# Patient Record
Sex: Female | Born: 1985 | Race: Black or African American | Hispanic: No | Marital: Single | Smoking: Current every day smoker
Health system: Southern US, Community
[De-identification: ages and names within clinical notes are randomized; demographics above are authoritative.]

## PROBLEM LIST (undated history)

## (undated) DIAGNOSIS — F32A Depression, unspecified: Secondary | ICD-10-CM

## (undated) HISTORY — PX: CYST EXCISION: SHX5701

## (undated) HISTORY — DX: Depression, unspecified: F32.A

---

## 2006-06-16 ENCOUNTER — Emergency Department (HOSPITAL_COMMUNITY): Admission: EM | Admit: 2006-06-16 | Discharge: 2006-06-16 | Payer: Self-pay | Admitting: Emergency Medicine

## 2006-06-25 ENCOUNTER — Emergency Department (HOSPITAL_COMMUNITY): Admission: EM | Admit: 2006-06-25 | Discharge: 2006-06-25 | Payer: Self-pay | Admitting: Family Medicine

## 2006-06-29 ENCOUNTER — Emergency Department (HOSPITAL_COMMUNITY): Admission: EM | Admit: 2006-06-29 | Discharge: 2006-06-29 | Payer: Self-pay | Admitting: Emergency Medicine

## 2006-09-28 ENCOUNTER — Emergency Department (HOSPITAL_COMMUNITY): Admission: EM | Admit: 2006-09-28 | Discharge: 2006-09-28 | Payer: Self-pay | Admitting: Emergency Medicine

## 2006-09-30 ENCOUNTER — Emergency Department (HOSPITAL_COMMUNITY): Admission: EM | Admit: 2006-09-30 | Discharge: 2006-09-30 | Payer: Self-pay | Admitting: Family Medicine

## 2006-10-07 ENCOUNTER — Emergency Department (HOSPITAL_COMMUNITY): Admission: EM | Admit: 2006-10-07 | Discharge: 2006-10-07 | Payer: Self-pay | Admitting: Emergency Medicine

## 2007-02-16 ENCOUNTER — Emergency Department (HOSPITAL_COMMUNITY): Admission: EM | Admit: 2007-02-16 | Discharge: 2007-02-16 | Payer: Self-pay | Admitting: Emergency Medicine

## 2007-05-09 ENCOUNTER — Emergency Department (HOSPITAL_COMMUNITY): Admission: EM | Admit: 2007-05-09 | Discharge: 2007-05-09 | Payer: Self-pay | Admitting: Emergency Medicine

## 2007-07-06 ENCOUNTER — Emergency Department (HOSPITAL_COMMUNITY): Admission: EM | Admit: 2007-07-06 | Discharge: 2007-07-06 | Payer: Self-pay | Admitting: Emergency Medicine

## 2007-09-08 ENCOUNTER — Emergency Department (HOSPITAL_COMMUNITY): Admission: EM | Admit: 2007-09-08 | Discharge: 2007-09-08 | Payer: Self-pay | Admitting: Emergency Medicine

## 2007-12-05 ENCOUNTER — Emergency Department (HOSPITAL_COMMUNITY): Admission: EM | Admit: 2007-12-05 | Discharge: 2007-12-05 | Payer: Self-pay | Admitting: Emergency Medicine

## 2008-04-05 ENCOUNTER — Emergency Department (HOSPITAL_COMMUNITY): Admission: EM | Admit: 2008-04-05 | Discharge: 2008-04-05 | Payer: Self-pay | Admitting: Emergency Medicine

## 2008-10-17 ENCOUNTER — Emergency Department (HOSPITAL_COMMUNITY): Admission: EM | Admit: 2008-10-17 | Discharge: 2008-10-17 | Payer: Self-pay | Admitting: Emergency Medicine

## 2009-04-05 ENCOUNTER — Emergency Department (HOSPITAL_COMMUNITY): Admission: EM | Admit: 2009-04-05 | Discharge: 2009-04-05 | Payer: Self-pay | Admitting: Family Medicine

## 2009-08-29 ENCOUNTER — Emergency Department (HOSPITAL_COMMUNITY): Admission: EM | Admit: 2009-08-29 | Discharge: 2009-08-29 | Payer: Self-pay | Admitting: Family Medicine

## 2010-10-21 ENCOUNTER — Emergency Department (HOSPITAL_COMMUNITY)
Admission: EM | Admit: 2010-10-21 | Discharge: 2010-10-21 | Disposition: A | Discharge status: Home / Self Care | Payer: Worker's Compensation | Attending: Emergency Medicine | Admitting: Emergency Medicine

## 2010-10-21 ENCOUNTER — Emergency Department (HOSPITAL_COMMUNITY): Payer: Self-pay

## 2010-10-21 DIAGNOSIS — S8990XA Unspecified injury of unspecified lower leg, initial encounter: Secondary | ICD-10-CM | POA: Insufficient documentation

## 2010-10-21 DIAGNOSIS — S9780XA Crushing injury of unspecified foot, initial encounter: Secondary | ICD-10-CM | POA: Insufficient documentation

## 2010-10-21 DIAGNOSIS — Y990 Civilian activity done for income or pay: Secondary | ICD-10-CM | POA: Insufficient documentation

## 2010-10-21 DIAGNOSIS — W208XXA Other cause of strike by thrown, projected or falling object, initial encounter: Secondary | ICD-10-CM | POA: Insufficient documentation

## 2010-10-21 DIAGNOSIS — J45909 Unspecified asthma, uncomplicated: Secondary | ICD-10-CM | POA: Insufficient documentation

## 2010-10-21 DIAGNOSIS — M79609 Pain in unspecified limb: Secondary | ICD-10-CM | POA: Insufficient documentation

## 2010-11-27 ENCOUNTER — Inpatient Hospital Stay (INDEPENDENT_AMBULATORY_CARE_PROVIDER_SITE_OTHER)
Admission: RE | Admit: 2010-11-27 | Discharge: 2010-11-27 | Disposition: A | Discharge status: Home / Self Care | Payer: Self-pay | Source: Ambulatory Visit | Attending: Family Medicine | Admitting: Family Medicine

## 2010-11-27 DIAGNOSIS — J309 Allergic rhinitis, unspecified: Secondary | ICD-10-CM

## 2011-06-11 LAB — POCT RAPID STREP A: Streptococcus, Group A Screen (Direct): POSITIVE — AB

## 2011-08-08 ENCOUNTER — Encounter: Payer: Self-pay | Admitting: *Deleted

## 2011-08-08 ENCOUNTER — Emergency Department (HOSPITAL_COMMUNITY): Payer: Self-pay

## 2011-08-08 ENCOUNTER — Emergency Department (HOSPITAL_COMMUNITY)
Admission: EM | Admit: 2011-08-08 | Discharge: 2011-08-08 | Disposition: A | Discharge status: Home / Self Care | Payer: Self-pay | Attending: Emergency Medicine | Admitting: Emergency Medicine

## 2011-08-08 ENCOUNTER — Other Ambulatory Visit: Payer: Self-pay

## 2011-08-08 DIAGNOSIS — R0602 Shortness of breath: Secondary | ICD-10-CM | POA: Insufficient documentation

## 2011-08-08 DIAGNOSIS — R079 Chest pain, unspecified: Secondary | ICD-10-CM | POA: Insufficient documentation

## 2011-08-08 DIAGNOSIS — IMO0001 Reserved for inherently not codable concepts without codable children: Secondary | ICD-10-CM | POA: Insufficient documentation

## 2011-08-08 DIAGNOSIS — J40 Bronchitis, not specified as acute or chronic: Secondary | ICD-10-CM | POA: Insufficient documentation

## 2011-08-08 MED ORDER — HYDROCOD POLST-CHLORPHEN POLST 10-8 MG/5ML PO LQCR
5.0000 mL | Freq: Once | ORAL | Status: AC
  Administered 2011-08-08: 5 mL via ORAL
  Filled 2011-08-08: qty 5

## 2011-08-08 MED ORDER — HYDROCOD POLST-CHLORPHEN POLST 10-8 MG/5ML PO LQCR: 5.0000 mL | Freq: Two times a day (BID) | ORAL | Status: DC

## 2011-08-08 NOTE — ED Notes (Signed)
Pt states she started to have feel achy and fatigue yesterday. Pt states she is having body aches, chest pain with cough, and sob

## 2011-08-08 NOTE — ED Notes (Signed)
Pt given discharge instructions and stated understanding. amb indep to discharge window

## 2011-08-08 NOTE — ED Notes (Signed)
ekg completed in triage.

## 2011-08-08 NOTE — ED Notes (Signed)
Pt provided meal tray

## 2011-08-08 NOTE — ED Provider Notes (Signed)
History     CSN: [id number] Arrival date & time: 08/08/2011 11:18 AM   First MD Initiated Contact with Patient 08/08/11 1213      Chief Complaint  Patient presents with  . Chest Pain  . Shortness of Breath    (Consider location/radiation/quality/duration/timing/severity/associated sxs/prior treatment) HPI Complains of cough with greenish sputum, blood-tinged onset this morning accompanied by headache slight sore throat and chills and mild shortness of breath. Chest pain is pleuritic in nature diffuse and nonradiating moderate in quality. Also complains of diffuse myalgias no other associated symptoms. Questionable fever has not taken her temperature Past Medical History  Diagnosis Date  . Asthma     History reviewed. No pertinent past surgical history.  History reviewed. No pertinent family history.  History  Substance Use Topics  . Smoking status: Current Everyday Smoker  . Smokeless tobacco: Not on file  . Alcohol Use: Yes     occa    OB History    Grav Para Term Preterm Abortions TAB SAB Ect Mult Living                  Review of Systems  Constitutional: Negative.   HENT: Negative.   Respiratory: Positive for cough and shortness of breath.   Cardiovascular: Negative.   Gastrointestinal: Negative.   Musculoskeletal: Positive for myalgias.  Skin: Negative.   Neurological: Negative.   Hematological: Negative.   Psychiatric/Behavioral: Negative.   All other systems reviewed and are negative.    Allergies  Penicillins  Home Medications  No current outpatient prescriptions on file.  BP 128/97  Pulse 89  Temp(Src) 98.3 F (36.8 C) (Oral)  Resp 22  Ht 5\' 7"  (1.702 m)  Wt 165 lb (74.844 kg)  BMI 25.84 kg/m2  SpO2 100%  LMP 07/31/2011  Physical Exam  Nursing note and vitals reviewed. Constitutional: She appears well-developed and well-nourished. No distress.  HENT:  Head: Normocephalic and atraumatic.  Mouth/Throat: No oropharyngeal exudate.    Bilateral tympanic membranes normal  Eyes: Conjunctivae are normal. Pupils are equal, round, and reactive to light.  Neck: Neck supple. No tracheal deviation present. No thyromegaly present.  Cardiovascular: Normal rate and regular rhythm.   No murmur heard. Pulmonary/Chest: Effort normal and breath sounds normal.       Coughing occasionally  Abdominal: Soft. Bowel sounds are normal. She exhibits no distension. There is no tenderness.  Musculoskeletal: Normal range of motion. She exhibits no edema and no tenderness.  Lymphadenopathy:    She has no cervical adenopathy.  Neurological: She is alert. Coordination normal.  Skin: Skin is warm and dry. No rash noted.  Psychiatric: She has a normal mood and affect.   Date: 08/08/2011  Rate: 80  Rhythm: normal sinus rhythm  QRS Axis: normal  Intervals: normal  ST/T Wave abnormalities: normal  Conduction Disutrbances:none  Narrative Interpretation:   Old EKG Reviewed: unchanged Unchanged from September 28 2006  ED Course  Procedures (including critical care time)  Labs Reviewed - No data to display No results found.   No diagnosis found.  Feels improved after treatment with Tussionex. Patient has albuterol inhaler at home MDM  Stop smoking encourage Prescription Tussionex Symptoms felt to be viral Diagnosis bronchitis        Doug Sou, MD 08/08/11 1418

## 2011-12-13 ENCOUNTER — Encounter (HOSPITAL_COMMUNITY): Payer: Self-pay | Admitting: Emergency Medicine

## 2011-12-13 ENCOUNTER — Emergency Department (INDEPENDENT_AMBULATORY_CARE_PROVIDER_SITE_OTHER)
Admission: EM | Admit: 2011-12-13 | Discharge: 2011-12-13 | Disposition: A | Discharge status: Home / Self Care | Payer: Self-pay | Source: Home / Self Care | Attending: Family Medicine | Admitting: Family Medicine

## 2011-12-13 DIAGNOSIS — J029 Acute pharyngitis, unspecified: Secondary | ICD-10-CM

## 2011-12-13 LAB — POCT RAPID STREP A: Streptococcus, Group A Screen (Direct): NEGATIVE

## 2011-12-13 MED ORDER — LIDOCAINE VISCOUS 2 % MT SOLN: 20.0000 mL | OROMUCOSAL | Status: AC | PRN

## 2011-12-13 MED ORDER — CEFDINIR 300 MG PO CAPS: 300.0000 mg | ORAL_CAPSULE | Freq: Two times a day (BID) | ORAL | Status: AC

## 2011-12-13 NOTE — Discharge Instructions (Signed)
Your lab results were negative for strep throat. However, I will treat for streptococcal pharyngitis, despite a negative strep screen, and 4/4 Centor criteria. Continue supportive care with ibuprofen (Motrin, Advil) and/or acetaminophen (Tylenol) for fever control and control of pain and inflammation. You may alternate these every 4 hours; for example, if you take acetaminophen at noon, then you can take ibuprofen at 4 pm, then acetaminophen again at 8 pm, etc. Also, encourage aggressive hydration. Return to care should your symptoms not improve, or worsen in any way.

## 2011-12-13 NOTE — ED Notes (Signed)
PT HERE WITH SORE THROAT,DIFF SWALLOWING OR EATING AND NECK SWELLING X 3 DYS.PT HASN'T TAKEN ANY PAIN MEDS.FEVER,CHILLS,N,,V

## 2011-12-13 NOTE — ED Provider Notes (Signed)
History     CSN: [id number]  Arrival date & time 12/13/11  1506   First MD Initiated Contact with Patient 12/13/11 1548      Chief Complaint  Patient presents with  . Sore Throat  . Fever    (Consider location/radiation/quality/duration/timing/severity/associated sxs/prior treatment) HPI Comments: Lacey Huber presents for evaluation of sore throat, fever since yesterday. She denies any cough or nasal congestion. She denies any sick contacts. She notes pain with swallowing which is made difficult for her to eat and drink. Patient is a 26 y.o. female presenting with pharyngitis. The history is provided by the patient.  Sore Throat This is a new problem. The problem occurs constantly. The problem has not changed since onset.The symptoms are aggravated by swallowing and drinking. The symptoms are relieved by nothing.    Past Medical History  Diagnosis Date  . Asthma     History reviewed. No pertinent past surgical history.  No family history on file.  History  Substance Use Topics  . Smoking status: Current Everyday Smoker  . Smokeless tobacco: Not on file  . Alcohol Use: Yes     occa    OB History    Grav Para Term Preterm Abortions TAB SAB Ect Mult Living                  Review of Systems  Constitutional: Negative.   HENT: Positive for sore throat and trouble swallowing. Negative for congestion and rhinorrhea.   Eyes: Negative.   Respiratory: Negative.  Negative for cough.   Cardiovascular: Negative.   Gastrointestinal: Negative.   Genitourinary: Negative.   Musculoskeletal: Negative.   Skin: Negative.   Neurological: Negative.     Allergies  Penicillins  Home Medications   Current Outpatient Rx  Name Route Sig Dispense Refill  . CEFDINIR 300 MG PO CAPS Oral Take 1 capsule (300 mg total) by mouth 2 (two) times daily. 20 capsule 0  . HYDROCOD POLST-CPM POLST ER 10-8 MG/5ML PO LQCR Oral Take 5 mLs by mouth every 12 (twelve) hours. 50 mL 0  . LIDOCAINE  VISCOUS 2 % MT SOLN Oral Take 20 mLs by mouth as needed for pain. Gargle with 15 to 20 ml every 3 to 4 hours as needed for pain; do not swallow 100 mL 0    BP 131/87  Pulse 84  Temp(Src) 100.3 F (37.9 C) (Oral)  Resp 14  SpO2 99%  LMP 12/04/2011  Physical Exam  Nursing note and vitals reviewed. Constitutional: She is oriented to person, place, and time. She appears well-developed and well-nourished.  HENT:  Head: Normocephalic and atraumatic.  Right Ear: Tympanic membrane normal.  Left Ear: Tympanic membrane normal.  Mouth/Throat: Uvula is midline and mucous membranes are normal. Oropharyngeal exudate and posterior oropharyngeal edema present.  Eyes: EOM are normal.  Neck: Normal range of motion.  Cardiovascular: Normal rate, regular rhythm, S1 normal, S2 normal and normal heart sounds.   No murmur heard. Pulmonary/Chest: Effort normal and breath sounds normal. She has no decreased breath sounds. She has no wheezes. She has no rhonchi.  Musculoskeletal: Normal range of motion.  Neurological: She is alert and oriented to person, place, and time.  Skin: Skin is warm and dry.  Psychiatric: Her behavior is normal.    ED Course  Procedures (including critical care time)   Labs Reviewed  POCT RAPID STREP A ([facility]ARE ONLY)   No results found.   1. Pharyngitis       MDM  Labs reviewed; RST negative; will treat empirically given 4/4 Centor criteria; given PCN-allergy (rash), will treat with cefdinir        Renaee Munda, MD 12/13/11 [phone]

## 2012-12-27 ENCOUNTER — Encounter (HOSPITAL_COMMUNITY): Payer: Self-pay | Admitting: Emergency Medicine

## 2012-12-27 ENCOUNTER — Emergency Department (HOSPITAL_COMMUNITY)
Admission: EM | Admit: 2012-12-27 | Discharge: 2012-12-27 | Disposition: A | Discharge status: Home / Self Care | Payer: Self-pay | Attending: Emergency Medicine | Admitting: Emergency Medicine

## 2012-12-27 ENCOUNTER — Emergency Department (HOSPITAL_COMMUNITY): Payer: Self-pay

## 2012-12-27 DIAGNOSIS — Z880 Allergy status to penicillin: Secondary | ICD-10-CM | POA: Insufficient documentation

## 2012-12-27 DIAGNOSIS — Y939 Activity, unspecified: Secondary | ICD-10-CM | POA: Insufficient documentation

## 2012-12-27 DIAGNOSIS — S6992XA Unspecified injury of left wrist, hand and finger(s), initial encounter: Secondary | ICD-10-CM

## 2012-12-27 DIAGNOSIS — W19XXXA Unspecified fall, initial encounter: Secondary | ICD-10-CM | POA: Insufficient documentation

## 2012-12-27 DIAGNOSIS — S6990XA Unspecified injury of unspecified wrist, hand and finger(s), initial encounter: Secondary | ICD-10-CM | POA: Insufficient documentation

## 2012-12-27 DIAGNOSIS — J45909 Unspecified asthma, uncomplicated: Secondary | ICD-10-CM | POA: Insufficient documentation

## 2012-12-27 DIAGNOSIS — W1809XA Striking against other object with subsequent fall, initial encounter: Secondary | ICD-10-CM | POA: Insufficient documentation

## 2012-12-27 DIAGNOSIS — F17200 Nicotine dependence, unspecified, uncomplicated: Secondary | ICD-10-CM | POA: Insufficient documentation

## 2012-12-27 DIAGNOSIS — S59909A Unspecified injury of unspecified elbow, initial encounter: Secondary | ICD-10-CM | POA: Insufficient documentation

## 2012-12-27 DIAGNOSIS — Y929 Unspecified place or not applicable: Secondary | ICD-10-CM | POA: Insufficient documentation

## 2012-12-27 DIAGNOSIS — S59919A Unspecified injury of unspecified forearm, initial encounter: Secondary | ICD-10-CM | POA: Insufficient documentation

## 2012-12-27 MED ORDER — NAPROXEN 500 MG PO TABS: 500.0000 mg | ORAL_TABLET | Freq: Two times a day (BID) | ORAL | Status: DC

## 2012-12-27 MED ORDER — HYDROCODONE-ACETAMINOPHEN 5-325 MG PO TABS
2.0000 | ORAL_TABLET | Freq: Once | ORAL | Status: AC
  Administered 2012-12-27: 2 via ORAL
  Filled 2012-12-27: qty 2

## 2012-12-27 MED ORDER — TRAMADOL HCL 50 MG PO TABS: 50.0000 mg | ORAL_TABLET | Freq: Four times a day (QID) | ORAL | Status: DC | PRN

## 2012-12-27 NOTE — ED Notes (Signed)
Pt has a ride home.  
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
## 2012-12-27 NOTE — ED Provider Notes (Signed)
Medical screening examination/treatment/procedure(s) were performed by non-physician practitioner and as supervising physician I was immediately available for consultation/collaboration.  [name], MD 12/27/12 1902

## 2014-02-24 ENCOUNTER — Emergency Department (HOSPITAL_COMMUNITY)
Admission: EM | Admit: 2014-02-24 | Discharge: 2014-02-24 | Disposition: A | Discharge status: Home / Self Care | Payer: No Typology Code available for payment source | Attending: Emergency Medicine | Admitting: Emergency Medicine

## 2014-02-24 ENCOUNTER — Encounter (HOSPITAL_COMMUNITY): Payer: Self-pay | Admitting: Emergency Medicine

## 2014-02-24 ENCOUNTER — Emergency Department (HOSPITAL_COMMUNITY): Payer: No Typology Code available for payment source

## 2014-02-24 DIAGNOSIS — Y92410 Unspecified street and highway as the place of occurrence of the external cause: Secondary | ICD-10-CM | POA: Insufficient documentation

## 2014-02-24 DIAGNOSIS — S298XXA Other specified injuries of thorax, initial encounter: Secondary | ICD-10-CM | POA: Insufficient documentation

## 2014-02-24 DIAGNOSIS — M25511 Pain in right shoulder: Secondary | ICD-10-CM

## 2014-02-24 DIAGNOSIS — J45909 Unspecified asthma, uncomplicated: Secondary | ICD-10-CM | POA: Insufficient documentation

## 2014-02-24 DIAGNOSIS — R0789 Other chest pain: Secondary | ICD-10-CM

## 2014-02-24 DIAGNOSIS — S46909A Unspecified injury of unspecified muscle, fascia and tendon at shoulder and upper arm level, unspecified arm, initial encounter: Secondary | ICD-10-CM | POA: Insufficient documentation

## 2014-02-24 DIAGNOSIS — Z791 Long term (current) use of non-steroidal anti-inflammatories (NSAID): Secondary | ICD-10-CM | POA: Insufficient documentation

## 2014-02-24 DIAGNOSIS — S4980XA Other specified injuries of shoulder and upper arm, unspecified arm, initial encounter: Secondary | ICD-10-CM | POA: Insufficient documentation

## 2014-02-24 DIAGNOSIS — T148XXA Other injury of unspecified body region, initial encounter: Secondary | ICD-10-CM

## 2014-02-24 DIAGNOSIS — IMO0002 Reserved for concepts with insufficient information to code with codable children: Secondary | ICD-10-CM | POA: Insufficient documentation

## 2014-02-24 DIAGNOSIS — F17200 Nicotine dependence, unspecified, uncomplicated: Secondary | ICD-10-CM | POA: Insufficient documentation

## 2014-02-24 DIAGNOSIS — Y9389 Activity, other specified: Secondary | ICD-10-CM | POA: Insufficient documentation

## 2014-02-24 DIAGNOSIS — Z880 Allergy status to penicillin: Secondary | ICD-10-CM | POA: Insufficient documentation

## 2014-02-24 MED ORDER — IBUPROFEN 800 MG PO TABS: 800.0000 mg | ORAL_TABLET | Freq: Three times a day (TID) | ORAL | Status: AC

## 2014-02-24 MED ORDER — OXYCODONE-ACETAMINOPHEN 5-325 MG PO TABS: 2.0000 | ORAL_TABLET | ORAL | Status: DC | PRN

## 2014-02-24 MED ORDER — METHOCARBAMOL 750 MG PO TABS: 750.0000 mg | ORAL_TABLET | Freq: Four times a day (QID) | ORAL | Status: AC

## 2014-02-24 MED ORDER — OXYCODONE-ACETAMINOPHEN 5-325 MG PO TABS
2.0000 | ORAL_TABLET | Freq: Once | ORAL | Status: AC
  Administered 2014-02-24: 2 via ORAL
  Filled 2014-02-24: qty 2
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
## 2014-02-24 NOTE — ED Notes (Signed)
Patient transported to X-ray 
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
## 2015-10-24 ENCOUNTER — Ambulatory Visit: Payer: Self-pay

## 2015-10-25 ENCOUNTER — Encounter (HOSPITAL_COMMUNITY): Payer: Self-pay

## 2015-10-25 ENCOUNTER — Emergency Department (HOSPITAL_COMMUNITY)
Admission: EM | Admit: 2015-10-25 | Discharge: 2015-10-25 | Disposition: A | Discharge status: Home / Self Care | Payer: No Typology Code available for payment source | Attending: Emergency Medicine | Admitting: Emergency Medicine

## 2015-10-25 DIAGNOSIS — J45909 Unspecified asthma, uncomplicated: Secondary | ICD-10-CM | POA: Insufficient documentation

## 2015-10-25 DIAGNOSIS — L0231 Cutaneous abscess of buttock: Secondary | ICD-10-CM | POA: Insufficient documentation

## 2015-10-25 DIAGNOSIS — Z880 Allergy status to penicillin: Secondary | ICD-10-CM | POA: Insufficient documentation

## 2015-10-25 DIAGNOSIS — Z79899 Other long term (current) drug therapy: Secondary | ICD-10-CM | POA: Insufficient documentation

## 2015-10-25 DIAGNOSIS — F17210 Nicotine dependence, cigarettes, uncomplicated: Secondary | ICD-10-CM | POA: Insufficient documentation

## 2015-10-25 DIAGNOSIS — Z791 Long term (current) use of non-steroidal anti-inflammatories (NSAID): Secondary | ICD-10-CM | POA: Insufficient documentation

## 2015-10-25 MED ORDER — OXYCODONE-ACETAMINOPHEN 5-325 MG PO TABS
1.0000 | ORAL_TABLET | Freq: Once | ORAL | Status: AC
Start: 2015-10-25 — End: 2015-10-25
  Administered 2015-10-25: 1 via ORAL
  Filled 2015-10-25: qty 1

## 2015-10-25 MED ORDER — OXYCODONE-ACETAMINOPHEN 5-325 MG PO TABS: 1.0000 | ORAL_TABLET | Freq: Three times a day (TID) | ORAL | Status: AC | PRN

## 2015-10-25 MED ORDER — SULFAMETHOXAZOLE-TRIMETHOPRIM 800-160 MG PO TABS: 1.0000 | ORAL_TABLET | Freq: Two times a day (BID) | ORAL | Status: AC

## 2015-10-25 MED ORDER — LIDOCAINE HCL (PF) 1 % IJ SOLN
5.0000 mL | Freq: Once | INTRAMUSCULAR | Status: DC
  Filled 2015-10-25: qty 5

## 2015-10-25 NOTE — ED Notes (Signed)
Pt with abscess between buttocks x 1 week.

## 2015-10-25 NOTE — Discharge Instructions (Signed)
Follow up with your doctor or an urgent care in 2-3 days for wound recheck. You may return to the emergency department if you have  a fever that persists greater than 101 or your abscess appears to become infected (growing surrounding redness and warmth). Do not operate any heavy machinery while on pain medications. Do not consume alcohol on these medications either. You can also follow up with the general surgeon clinic listed above for wound check and further evaluation.   Abscess An abscess (boil or furuncle) is an infected area that contains a collection of pus.  SYMPTOMS Signs and symptoms of an abscess include pain, tenderness, redness, or hardness. You may feel a moveable soft area under your skin. An abscess can occur anywhere in the body.  TREATMENT  A surgical cut (incision) may be made over your abscess to drain the pus. Gauze may be packed into the space or a drain may be looped through the abscess cavity (pocket). This provides a drain that will allow the cavity to heal from the inside outwards. The abscess may be painful for a few days, but should feel much better if it was drained.  Your abscess, if seen early, may not have localized and may not have been drained. If not, another appointment may be required if it does not get better on its own or with medications. HOME CARE INSTRUCTIONS   Only take over-the-counter or prescription medicines for pain, discomfort, or fever as directed by your caregiver.   Take your antibiotics as directed if they were prescribed. Finish them even if you start to feel better.   Keep the skin and clothes clean around your abscess.   If the abscess was drained, you will need to use gauze dressing to collect any draining pus. Dressings will typically need to be changed 3 or more times a day.   The infection may spread by skin contact with others. Avoid skin contact as much as possible.   Practice good hygiene. This includes regular hand washing, cover  any draining skin lesions, and do not share personal care items.   If you participate in sports, do not share athletic equipment, towels, whirlpools, or personal care items. Shower after every practice or tournament.   If a draining area cannot be adequately covered:   Do not participate in sports.   Children should not participate in day care until the wound has healed or drainage stops.   If your caregiver has given you a follow-up appointment, it is very important to keep that appointment. Not keeping the appointment could result in a much worse infection, chronic or permanent injury, pain, and disability. If there is any problem keeping the appointment, you must call back to this facility for assistance.  SEEK MEDICAL CARE IF:   You develop increased pain, swelling, redness, drainage, or bleeding in the wound site.   You develop signs of generalized infection including muscle aches, chills, fever, or a general ill feeling.   You have an oral temperature above 102 F (38.9 C).  MAKE SURE YOU:   Understand these instructions.   Will watch your condition.   Will get help right away if you are not doing well or get worse.  Document Released: 05/29/2005 Document Revised: 05/01/2011 Document Reviewed: 03/22/2008 [facility] Patient Information 2012 [place], [place].

## 2015-10-25 NOTE — ED Provider Notes (Signed)
CSN: [id number]     Arrival date & time 10/25/15  1022 History  By signing my name below, I, Placido Sou, attest that this documentation has been prepared under the direction and in the presence of [facility], PA-C. Electronically Signed: Placido Sou, ED Scribe. 10/25/2015. 12:39 PM.    Chief Complaint  Patient presents with  . Abscess   The history is provided by the patient. No language interpreter was used.   HPI Comments: Lacey Huber is a 30 y.o. female who presents to the Emergency Department complaining of a point of worsening, moderate, throbbing, pain and swelling to her gluteal region onset 1 week ago. She has not taken any medications for pain management and denies any alleviating or aggravating factors. Pt denies a hx of abscesses. She denies drainage, fever, chills or any other associated symptoms at this time.    Past Medical History  Diagnosis Date  . Asthma    Past Surgical History  Procedure Laterality Date  . Cyst excision     Family History  Problem Relation Age of Onset  . Diabetes Mother   . Hypertension Mother   . Stroke Mother   . Cancer Mother    Social History  Substance Use Topics  . Smoking status: Current Every Day Smoker    Types: Cigarettes  . Smokeless tobacco: None  . Alcohol Use: Yes     Comment: occa   OB History    No data available     Review of Systems  Constitutional: Negative for fever and chills.  Musculoskeletal: Positive for myalgias.  Skin: Positive for color change and rash (abscess).    Allergies  Penicillins  Home Medications   Prior to Admission medications   Medication Sig Start Date End Date Taking? Authorizing Provider  ibuprofen (ADVIL,MOTRIN) 800 MG tablet Take 1 tablet (800 mg total) by mouth 3 (three) times daily. 02/24/14   Marisa Severin, MD  methocarbamol (ROBAXIN-750) 750 MG tablet Take 1 tablet (750 mg total) by mouth 4 (four) times daily. 02/24/14   Marisa Severin, MD  oxyCODONE-acetaminophen  (PERCOCET/ROXICET) 5-325 MG tablet Take 1 tablet by mouth every 8 (eight) hours as needed for severe pain. 10/25/15   Chase Picket Nicey Krah, PA-C  sulfamethoxazole-trimethoprim (BACTRIM DS,SEPTRA DS) 800-160 MG tablet Take 1 tablet by mouth 2 (two) times daily. 10/25/15 11/01/15  Chase Picket Deniz Hannan, PA-C   BP 119/84 mmHg  Pulse 87  Temp(Src) 98.2 F (36.8 C) (Oral)  Resp 18  SpO2 100%  LMP 10/18/2015    Physical Exam  Constitutional: She is oriented to person, place, and time. She appears well-developed and well-nourished.  HENT:  Head: Normocephalic and atraumatic.  Neck: Normal range of motion.  Cardiovascular: Normal rate, regular rhythm and normal heart sounds.  Exam reveals no gallop and no friction rub.   No murmur heard. Pulmonary/Chest: Effort normal and breath sounds normal. No respiratory distress. She has no wheezes. She has no rales.  Abdominal: Soft. She exhibits no distension. There is no tenderness.  Musculoskeletal: Normal range of motion.  Neurological: She is alert and oriented to person, place, and time.  Skin: Skin is warm and dry.  1.5 cm tender area of fluctuance c/w abscess at upper gluteal fold. No surrounding erythema.   Psychiatric: She has a normal mood and affect.  Nursing note and vitals reviewed.   ED Course  Procedures  DIAGNOSTIC STUDIES: Oxygen Saturation is 99% on RA, normal by my interpretation.    COORDINATION OF CARE:  11:55 PM Discussed next steps with pt including an I&D. She verbalized understanding and is agreeable with the plan.   INCISION AND DRAINAGE PROCEDURE NOTE: Patient identification was confirmed and verbal consent was obtained. This procedure was performed by PA student Tobi Bastos under direct supervision of [employer], PA-C at 12:22 PM. Site: gluteal fold Sterile procedures observed Needle size: 23 Anesthetic used (type and amt): 5 ml Blade size: 11 Drainage: copious purulent Complexity: Complex Packing used: none.  Site  anesthetized, incision made over site, wound drained and explored loculations, rinsed with copious amounts of normal saline, wound packed with sterile gauze, covered with dry, sterile dressing.  Pt tolerated procedure well without complications.  Instructions for care discussed verbally and pt provided with additional written instructions for homecare and f/u.   Labs Review Labs Reviewed - No data to display  Imaging Review No results found.   EKG Interpretation None      MDM   Final diagnoses:  Abscess of gluteal region    Patient with skin abscess. Incision and drainage performed in the ED today by student PA under my direct supervision. While performing I&D, purulent drainage possibly sprayed into student PA Anna's eye. Safety Zone information filled out and labwork drawn. PA student made aware of process for follow up. Abscess was not large enough to warrant packing or drain placement. Wound recheck in 2 days. Supportive care and return precautions discussed.  Pt sent home with Bactrim. The patient appears reasonably screened and/or stabilized for discharge and I doubt any other emergent medical condition requiring further screening, evaluation, or treatment in the ED prior to discharge.  I personally performed the services described in this documentation, which was scribed in my presence. The recorded information has been reviewed and is accurate.    [facility] Cola Gane, PA-C 10/25/15 1338  Nelva Nay, MD 10/26/15 1435

## 2017-09-06 ENCOUNTER — Emergency Department
Admission: EM | Admit: 2017-09-06 | Discharge: 2017-09-06 | Disposition: A | Discharge status: Home / Self Care | Payer: Self-pay | Attending: Emergency Medicine | Admitting: Emergency Medicine

## 2017-09-06 DIAGNOSIS — F17200 Nicotine dependence, unspecified, uncomplicated: Secondary | ICD-10-CM | POA: Insufficient documentation

## 2017-09-06 DIAGNOSIS — J029 Acute pharyngitis, unspecified: Secondary | ICD-10-CM | POA: Insufficient documentation

## 2017-09-06 DIAGNOSIS — J069 Acute upper respiratory infection, unspecified: Secondary | ICD-10-CM

## 2017-09-06 DIAGNOSIS — R0981 Nasal congestion: Secondary | ICD-10-CM | POA: Insufficient documentation

## 2017-09-06 LAB — POCT RAPID STREP A: Rapid Strep A Screen POCT: NEGATIVE

## 2017-09-06 MED ORDER — BENZOCAINE (TOPICAL) 20 % EX AERO
INHALATION_SPRAY | Freq: Three times a day (TID) | CUTANEOUS | 0 refills | Status: DC | PRN
Start: 2017-09-06 — End: 2018-02-16

## 2017-09-06 MED ORDER — GUAIFENESIN-CODEINE 100-10 MG/5ML PO SYRP
5.0000 mL | ORAL_SOLUTION | Freq: Three times a day (TID) | ORAL | 0 refills | Status: DC | PRN
Start: 2017-09-06 — End: 2018-02-16

## 2017-09-06 NOTE — ED Provider Notes (Signed)
EMERGENCY DEPARTMENT HISTORY AND PHYSICAL EXAM     Physician/Midlevel provider first contact with patient: 09/06/17 1142         Date: 09/06/2017  Patient Name: Margaret Nicholson    History of Presenting Illness     History Provided By: pt, partner at bedside    Chief Complaint   Patient presents with   . Sore Throat     Onset:   Timing:   Location:   Quality:   Severity:   Modifying Factors:   Associated Symptoms:     Additional History: Margaret Nicholson is a 33 y.o. female with no PMH BIB self with c/o nasal congestion, ST, cough since yesterday afternoon. ST is worse on swallowing. Denies fever, HA, difficulty swallowing, SOB, hemoptysis, purulent sputum, wheezing. Has been eating and drinking without difficulty. Took mucinex last night to good effect. No other meds taken.     PCP: Pcp, Noneorunknown, MD      No current facility-administered medications for this encounter.      Current Outpatient Prescriptions   Medication Sig Dispense Refill   . benzocaine (HURRICAINE) 20 % oral spray Apply topically 3 (three) times daily as needed (sore throat). 1 each 0   . guaiFENesin-codeine (ROBITUSSIN AC) 100-10 MG/5ML syrup Take 5 mLs by mouth 3 (three) times daily as needed for Cough. 80 mL 0       Past History     Past Medical History:  History reviewed. No pertinent past medical history.    Past Surgical History:  Past Surgical History:   Procedure Laterality Date   . CYST REMOVAL      neck       Family History:  History reviewed. No pertinent family history.    Social History:  Social History   Substance Use Topics   . Smoking status: Smoker, Current Status Unknown   . Smokeless tobacco: Never Used   . Alcohol use Not on file       Allergies:  No Known Allergies    Review of Systems     Review of Systems   Constitutional: Negative for fever.   HENT: Positive for congestion and sore throat.    Eyes: Negative for redness.   Respiratory: Positive for cough. Negative for shortness of breath.    Cardiovascular: Negative for  chest pain.   Gastrointestinal: Negative for vomiting.   Musculoskeletal: Negative for falls.   Skin: Negative for rash.   Neurological: Negative for headaches.   Endo/Heme/Allergies: Does not bruise/bleed easily.   Psychiatric/Behavioral: Negative for substance abuse.       Physical Exam     Vitals:    09/06/17 1130 09/06/17 1131   BP:  142/90   Pulse:  89   Resp:  18   Temp:  98.9 F (37.2 C)   SpO2:  98%   Weight: 83.5 kg    Height: 5\' 6"  (1.676 m)        Physical Exam   Constitutional: She is oriented to person, place, and time. She appears well-developed and well-nourished.  Non-toxic appearance. No distress.   Sitting comfortably, no drooling, trismus, or stridor   HENT:   Head: Normocephalic and atraumatic.   Right Ear: Hearing and tympanic membrane normal.   Left Ear: Hearing and tympanic membrane normal.   Mouth/Throat: Uvula is midline and mucous membranes are normal. No oropharyngeal exudate. Tonsils are 0 on the right. Tonsils are 0 on the left.   Nasal congestion.  Mild injection of  posterior oropharynx.    Eyes: Pupils are equal, round, and reactive to light. Conjunctivae, EOM and lids are normal.   Neck: Normal range of motion and phonation normal. Neck supple.   Cardiovascular: Normal rate and regular rhythm.    Pulmonary/Chest: Effort normal and breath sounds normal. No stridor.   Abdominal: Soft. There is no tenderness. There is no guarding.   Lymphadenopathy:     She has no cervical adenopathy.   Neurological: She is alert and oriented to person, place, and time. No cranial nerve deficit. Gait normal.   Skin: Skin is warm and dry.   Psychiatric: She has a normal mood and affect. Her behavior is normal.   Nursing note and vitals reviewed.      Diagnostic Study Results     Labs -     Results     Procedure Component Value Units Date/Time    Rapid Group A Strep POC [[id number]] Collected:  09/06/17 1139    Specimen:  Throat Updated:  09/06/17 1144     POCT QC Pass     Rapid Strep A Screen POCT  Negative      Comment Negative Results should be confirmed by throat Cx to confirm absence of Strep A inf.          Radiologic Studies -   Radiology Results (24 Hour)     ** No results found for the last 24 hours. **      .      Medical Decision Making   I am the first provider for this patient.    I reviewed the vital signs, available nursing notes, past medical history, past surgical history, family history and social history.    Vital Signs-Reviewed the patient's vital signs.     Vitals:    09/06/17 1130 09/06/17 1131   BP:  142/90   Pulse:  89   Resp:  18   Temp:  98.9 F (37.2 C)   SpO2:  98%   Weight: 83.5 kg    Height: 5\' 6"  (1.676 m)        Pulse Oximetry Analysis - Normal 98% on RA    Old Medical Records: Nursing notes.     Clinical Course/Medical Decision Making:     Pt well appearing, NAD. Rapid strep neg, will await culture. Advised on likely viral etiology and discussed med use and supportive care at home. F/u with PCP/TCC. Strict ED return precautions given. All questions were answered and all concerns were addressed. Pt is stable, agrees with plan, and is amenable to discharge.    Diagnosis     Clinical Impression:   1. Pharyngitis, unspecified etiology    2. Upper respiratory tract infection, unspecified type        _______________________________    Attestations:  Thea Gist, PA-C, am the primary clinician of record.    Signed by: Sondra Barges, PA-C    _______________________________       Orlene Erm, PA  09/06/17 1832       Donny Pique, MD  09/07/17 [phone]

## 2017-09-06 NOTE — Discharge Instructions (Signed)
Pharyngitis     You have been diagnosed with pharyngitis.     Pharyngitis is an infection of the back of your throat. Most sore throats are caused by viruses and do not require antibiotics. Some sore throats are caused by bacteria. Antibiotics will help this type of sore throat. A test for Strep throat may be used to help in your diagnosis.     Symptoms of pharyngitis include fever (temperature higher than 100.4ºF / 38ºC), sore throat, and a hoarse voice. If you have cold symptoms such as sneezing and coughing, runny nose, or congestion, your sore throat is more likely to be caused by a virus and not bacteria.     Whether your sore throat is caused by a virus or bacteria, you may need medication for pain and fever. You should also drink a lot of fluid. If your sore throat is caused by bacteria, you will also need antibiotics. If your sore throat is caused by a virus, you do not need antibiotics. Antibiotics will not kill the virus and they may cause side-effects, like diarrhea, abdominal cramps, or allergic reactions. Taking an antibiotic that you do not need may cause “resistance,” meaning that antibiotic won’t work in the future when you have a true bacterial infection.     YOU SHOULD SEEK MEDICAL ATTENTION IMMEDIATELY, EITHER HERE OR AT THE NEAREST EMERGENCY DEPARTMENT, IF ANY OF THE FOLLOWING OCCURS:  · You have difficulty breathing.  · Your voices changes or seems muffled.  · You have trouble swallowing.  · You have a fever (temperature higher than 100.4ºF / 38ºC) that won’t go away.  · You feel worse or do not improve after 2 to 3 days.             Upper Respiratory Infection     You have been diagnosed with a viral upper respiratory infection, often called a "URI."     The symptoms of a viral upper respiratory infection may include fever (temperature higher than 100.4ºF / 38ºC), runny nose, congestion and sinus fullness, facial pain, earache, sore throat, cough, and occasionally wheezing. The symptoms usually  improve within 3 to 4 days. It might take up to 10 days before you feel completely better.     URI’s are treated with fluids, rest, and medication for fever and pain. Sometimes decongestants, cough medications or-medications for wheezing can also help.     Antibiotics have NO effect whatsoever on viruses and ARE NOT needed for a URI. Taking antibiotics when they are not necessary can cause side effects (like diarrhea or allergic reactions). It can also cause resistance, meaning antibiotics won’t work when you need them in the future.     YOU SHOULD SEEK MEDICAL ATTENTION IMMEDIATELY, EITHER HERE OR AT THE NEAREST EMERGENCY DEPARTMENT, IF ANY OF THE FOLLOWING OCCURS:  · You have new or worse symptoms or concerns.  · You are short of breath.  · You have a severe headache, stiff neck, confusion, or problems thinking.  · You have nasal discharge, fever (temperature higher than 100.4ºF / 38ºC), or productive cough (one that brings up mucous from the lungs) lasting more that 10 days. Occasionally a viral cold may lead into a bacterial infection, such as a sinus infection, ear infection or pneumonia. Taking antibiotics during the cold will NOT prevent these infections, but if a secondary infection occurs, you might require additional treatment.     If you can't follow up with your doctor, or if at any time you feel you need to be rechecked or seen again, come back here   or go to the nearest emergency department.

## 2017-09-06 NOTE — ED Triage Notes (Signed)
Margaret Nicholson is a 32 y.o. female  Presenting with sore throat, worse with talking and swallowing since yesterday. Denies difficulty breathing. Having burning sensation in chest and back with cough. Has tried mucinex OTC.     Blood pressure 142/90, pulse 89, temperature 98.9 F (37.2 C), resp. rate 18, height 5\' 6"  (1.676 m), weight 83.5 kg, last menstrual period 08/22/2017, SpO2 98 %.

## 2018-02-16 ENCOUNTER — Emergency Department
Admission: EM | Admit: 2018-02-16 | Discharge: 2018-02-16 | Disposition: A | Discharge status: Home / Self Care | Payer: Self-pay | Attending: Emergency Medicine | Admitting: Emergency Medicine

## 2018-02-16 ENCOUNTER — Emergency Department: Payer: Self-pay

## 2018-02-16 DIAGNOSIS — I8392 Asymptomatic varicose veins of left lower extremity: Secondary | ICD-10-CM | POA: Insufficient documentation

## 2018-02-16 DIAGNOSIS — F17210 Nicotine dependence, cigarettes, uncomplicated: Secondary | ICD-10-CM | POA: Insufficient documentation

## 2018-02-16 DIAGNOSIS — R748 Abnormal levels of other serum enzymes: Secondary | ICD-10-CM | POA: Insufficient documentation

## 2018-02-16 DIAGNOSIS — M79662 Pain in left lower leg: Secondary | ICD-10-CM | POA: Insufficient documentation

## 2018-02-16 LAB — CBC AND DIFFERENTIAL
Absolute NRBC: 0 10*3/uL (ref 0.00–0.00)
Basophils Absolute Automated: 0.03 10*3/uL (ref 0.00–0.08)
Basophils Automated: 0.6 %
Eosinophils Absolute Automated: 0.13 10*3/uL (ref 0.00–0.44)
Eosinophils Automated: 2.4 %
Hematocrit: 38.5 % (ref 34.7–43.7)
Hgb: 12.5 g/dL (ref 11.4–14.8)
Immature Granulocytes Absolute: 0.01 10*3/uL (ref 0.00–0.07)
Immature Granulocytes: 0.2 %
Lymphocytes Absolute Automated: 1.88 10*3/uL (ref 0.42–3.22)
Lymphocytes Automated: 35.1 %
MCH: 29.3 pg (ref 25.1–33.5)
MCHC: 32.5 g/dL (ref 31.5–35.8)
MCV: 90.4 fL (ref 78.0–96.0)
MPV: 10.7 fL (ref 8.9–12.5)
Monocytes Absolute Automated: 0.54 10*3/uL (ref 0.21–0.85)
Monocytes: 10.1 %
Neutrophils Absolute: 2.77 10*3/uL (ref 1.10–6.33)
Neutrophils: 51.6 %
Nucleated RBC: 0 /100 WBC (ref 0.0–0.0)
Platelets: 214 10*3/uL (ref 142–346)
RBC: 4.26 10*6/uL (ref 3.90–5.10)
RDW: 14 % (ref 11–15)
WBC: 5.36 10*3/uL (ref 3.10–9.50)

## 2018-02-16 LAB — COMPREHENSIVE METABOLIC PANEL
ALT: 92 U/L — ABNORMAL HIGH (ref 0–55)
AST (SGOT): 81 U/L — ABNORMAL HIGH (ref 5–34)
Albumin/Globulin Ratio: 1.2 (ref 0.9–2.2)
Albumin: 4.2 g/dL (ref 3.5–5.0)
Alkaline Phosphatase: 79 U/L (ref 37–106)
Anion Gap: 9 (ref 5.0–15.0)
BUN: 11 mg/dL (ref 7.0–19.0)
Bilirubin, Total: 0.4 mg/dL (ref 0.2–1.2)
CO2: 24 mEq/L (ref 22–29)
Calcium: 9.5 mg/dL (ref 8.5–10.5)
Chloride: 104 mEq/L (ref 100–111)
Creatinine: 0.7 mg/dL (ref 0.6–1.0)
Globulin: 3.6 g/dL (ref 2.0–3.6)
Glucose: 88 mg/dL (ref 70–100)
Potassium: 4.3 mEq/L (ref 3.5–5.1)
Protein, Total: 7.8 g/dL (ref 6.0–8.3)
Sodium: 137 mEq/L (ref 136–145)

## 2018-02-16 LAB — URINALYSIS, REFLEX TO MICROSCOPIC EXAM IF INDICATED
Bilirubin, UA: NEGATIVE
Glucose, UA: NEGATIVE
Ketones UA: NEGATIVE
Leukocyte Esterase, UA: NEGATIVE
Nitrite, UA: NEGATIVE
Protein, UR: NEGATIVE
Specific Gravity UA: 1.025 (ref 1.001–1.035)
Urine pH: 6.5 (ref 5.0–8.0)
Urobilinogen, UA: 0.2 mg/dL

## 2018-02-16 LAB — URINE HCG QUALITATIVE: Urine HCG Qualitative: NEGATIVE

## 2018-02-16 LAB — GFR: EGFR: >60

## 2018-02-16 MED ORDER — KETOROLAC TROMETHAMINE 30 MG/ML IJ SOLN
30.00 mg | Freq: Once | INTRAMUSCULAR | Status: AC
Start: 2018-02-16 — End: 2018-02-16
  Administered 2018-02-16: 14:00:00 30 mg via INTRAVENOUS
  Filled 2018-02-16: qty 1

## 2018-02-16 NOTE — ED Provider Notes (Signed)
EMERGENCY DEPARTMENT HISTORY AND PHYSICAL EXAM     Physician/Midlevel provider first contact with patient: 02/16/18 1248         Date: 02/16/2018  Patient Name: Margaret Nicholson    History of Presenting Illness     History Provided By: patient     Chief Complaint   Patient presents with   . Leg Pain     Onset:   Timing:   Location:   Quality:   Severity:   Modifying Factors:   Associated Symptoms:     Additional History: Margaret Nicholson is a 32 y.o. female with no PMH p/w atraumatic L calf pain and swelling x several days. Pt feels the calf is "tight and crampy". She also c/o distended veins in the leg, which is new for her. Pain radiates up from the calf to her upper leg and hip. She admits intermittent tingling at the sole of the L foot. Denies numbness, weakness, CP, SOB. She reports a family h/o DVT (mother). 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 Denies OCP use, recent surgery/flight/immobilization.     PCP: Pcp, None, MD      No current facility-administered medications for this encounter.      No current outpatient prescriptions on file.       Past History     Past Medical History:  History reviewed. No pertinent past medical history.    Past Surgical History:  Past Surgical History:   Procedure Laterality Date   . CYST REMOVAL      neck       Family History:  History reviewed. No pertinent family history.    Social History:  Social History   Substance Use Topics   . Smoking status: Current Every Day Smoker     Types: Cigarettes   . Smokeless tobacco: Never Used      Comment: 2-3 cigarettes per day   . Alcohol use Yes       Allergies:  Allergies   Allergen Reactions   . Penicillins        Review of Systems     Review of Systems   Constitutional: Negative for fever.   Eyes: Negative for redness.   Respiratory: Negative for cough, shortness of breath and stridor.    Cardiovascular: Negative for chest pain.   Gastrointestinal: Negative for nausea and vomiting.   Genitourinary: Negative  for flank pain.   Musculoskeletal:        Leg pain and swelling   Skin: Negative for rash.   Neurological: Positive for tingling. Negative for focal weakness.   Endo/Heme/Allergies:        Family h/o blood clots   Psychiatric/Behavioral: Negative for substance abuse.       Physical Exam     Vitals:    02/16/18 1231 02/16/18 1534   BP: (!) 156/93 (!) 143/98   Pulse: 71 70   Resp: 16 16   Temp: 98.1 F (36.7 C)    TempSrc: Oral    SpO2: 98%    Weight: 79.4 kg    Height: 5\' 6"  (1.676 m)        Physical Exam   Constitutional: She is oriented to person, place, and time. She appears well-developed and well-nourished.  Non-toxic appearance. No distress.   HENT:   Head: Normocephalic and atraumatic.   Right Ear: Hearing normal.   Left Ear: Hearing normal.   Eyes: Conjunctivae, EOM and lids are normal.   Neck: Normal range of motion and  phonation normal. Neck supple.   Cardiovascular: Normal rate and regular rhythm.    Pulses:       Dorsalis pedis pulses are 2+ on the right side, and 2+ on the left side.   Pulmonary/Chest: Effort normal and breath sounds normal.   Musculoskeletal:        Right lower leg: She exhibits no tenderness and no swelling.   L calf TTP. Mild swelling and varicosities present. Subjective tingling at sole of L foot reported, sensation to light touch intact. Cap refill < 2 sec throughout.    Neurological: She is alert and oriented to person, place, and time. Gait normal.   Skin: Skin is warm and dry.   Psychiatric: She has a normal mood and affect. Her behavior is normal.   Nursing note and vitals reviewed.      Diagnostic Study Results     Labs -     Results     Procedure Component Value Units Date/Time    Comprehensive metabolic panel [[id number]]  (Abnormal) Collected:  02/16/18 1330    Specimen:  Blood Updated:  02/16/18 1400     Glucose 88 mg/dL      BUN 40.9 mg/dL      Creatinine 0.7 mg/dL      Sodium 811 mEq/L      Potassium 4.3 mEq/L      Chloride 104 mEq/L      CO2 24 mEq/L      Calcium 9.5  mg/dL      Protein, Total 7.8 g/dL      Albumin 4.2 g/dL      AST (SGOT) 81 (H) U/L      ALT 92 (H) U/L      Alkaline Phosphatase 79 U/L      Bilirubin, Total 0.4 mg/dL      Globulin 3.6 g/dL      Albumin/Globulin Ratio 1.2     Anion Gap 9.0    GFR [[id number]] Collected:  02/16/18 1330     Updated:  02/16/18 1400     EGFR >60.0    UA with reflex to micro (pts  3 + yrs) [[id number]]  (Abnormal) Collected:  02/16/18 1325    Specimen:  Urine Updated:  02/16/18 1345     Urine Type Clean Catch     Color, UA Yellow     Clarity, UA Clear     Specific Gravity UA 1.025     Urine pH 6.5     Leukocyte Esterase, UA Negative     Nitrite, UA Negative     Protein, UR Negative     Glucose, UA Negative     Ketones UA Negative     Urobilinogen, UA 0.2 mg/dL      Bilirubin, UA Negative     Blood, UA Trace (A)     RBC, UA 3 - 5 /hpf      WBC, UA 0-5 /hpf      Squamous Epithelial Cells, Urine 0-5 /hpf     Urine HCG, Qualitative [[id number]] Collected:  02/16/18 1330    Specimen:  Urine Updated:  02/16/18 1343     Urine HCG Qualitative Negative    CBC with differential [[id number]] Collected:  02/16/18 1330    Specimen:  Blood from Blood Updated:  02/16/18 1337     WBC 5.36 x10 3/uL      Hgb 12.5 g/dL      Hematocrit 27.2 %      Platelets 214 x10 3/uL  RBC 4.26 x10 6/uL      MCV 90.4 fL      MCH 29.3 pg      MCHC 32.5 g/dL      RDW 14 %      MPV 10.7 fL      Neutrophils 51.6 %      Lymphocytes Automated 35.1 %      Monocytes 10.1 %      Eosinophils Automated 2.4 %      Basophils Automated 0.6 %      Immature Granulocyte 0.2 %      Nucleated RBC 0.0 /100 WBC      Neutrophils Absolute 2.77 x10 3/uL      Abs Lymph Automated 1.88 x10 3/uL      Abs Mono Automated 0.54 x10 3/uL      Abs Eos Automated 0.13 x10 3/uL      Absolute Baso Automated 0.03 x10 3/uL      Absolute Immature Granulocyte 0.01 x10 3/uL      Absolute NRBC 0.00 x10 3/uL           Radiologic Studies -   Radiology Results (24 Hour)     Procedure Component Value Units Date/Time     US Venous Dopp Left Low Extrem [[id number]] Collected:  02/16/18 1507    Order Status:  Completed Updated:  02/16/18 1512    Narrative:       CLINICAL INDICATION: Swelling and pain.    FINDINGS: Duplex evaluation of the deep venous system of the left lower  extremity was performed. The common femoral vein, femoral vein and  popliteal vein were visualized and appear unremarkable. These veins are  compressible with no evidence of thrombus. Spectral and color doppler  demonstrate patency and normal directional and phasic flow. Appropriate  augmentation was observed. The proximal deep femoral vein appears  unremarkable. Portions of the posterior tibial and peroneal veins were  seen and appear unremarkable with no thrombus noted.      The opposite proximal common femoral vein was imaged and appears normal.      Impression:        No evidence of left lower extremity  deep vein thrombosis.            Kinnie Feil, MD   02/16/2018 3:08 PM      .      Medical Decision Making   I am the first provider for this patient.    I reviewed the vital signs, available nursing notes, past medical history, past surgical history, family history and social history.    Vital Signs-Reviewed the patient's vital signs.     Vitals:    02/16/18 1231 02/16/18 1534   BP: (!) 156/93 (!) 143/98   Pulse: 71 70   Resp: 16 16   Temp: 98.1 F (36.7 C)    TempSrc: Oral    SpO2: 98%    Weight: 79.4 kg    Height: 5\' 6"  (1.676 m)        Pulse Oximetry Analysis - Normal 98% on RA    Clinical Course/Medical Decision Making:     No evidence of DVT on U/S. Discussed with pt. Pain ?possibly related to varicose veins. Also advised on mildly elevated LFTs. Pt admits to drinking more than she should. Denies fevers, excessive tylenol or other medication use. Advised to f/u with PCP. Pt in agreement with plan.       Diagnosis     Clinical Impression:  1. Pain of left calf    2. Varicose veins of left lower extremity, unspecified whether complicated    3.  Elevated liver enzymes        _______________________________    Attestations:  Thea Gist, PA-C, am the primary clinician of record.    Signed by: Sondra Barges, PA-C    _______________________________       Orlene Erm, PA  02/16/18 1541       Westley Foots, MD  02/18/18 1416

## 2018-02-16 NOTE — Discharge Instructions (Signed)
Leg Pain    You have been diagnosed with leg pain.    Many things can cause leg pain. Most of the causes are not dangerous and will get better on their own. These can include muscle cramps, bruises, strains, pinched nerves, and minor skin infections.    You had an exam by your doctor today. He or she decided that the cause of your leg pain does not seem to be serious or dangerous.    During your visit, you had an ultrasound (duplex doppler) to make sure that you didn't have a blood clot in your leg.    If your pain continues, you might need another exam or more tests. This is to find out why you have leg pain. The cause of your symptoms doesn't seem dangerous now. You don't need to stay in the hospital.    Though we don't believe your condition is dangerous right now, it is important to be careful. Sometimes a problem that seems mild can become serious later. This is why it is very important that you return here or go to the nearest Emergency Department if you are not improving or your symptoms are getting worse.    Some things you may try at home are:    Over-the-counter pain medications like that have ibuprofen (Advil/Motrin) or acetaminophen (Tylenol) in them. Follow the directions on the package.   Rest the leg as needed. As soon as you are able, start moving your leg again to keep it from getting stiff.    Return here or go to the nearest Emergency Department or follow-up with your doctor if you are not getting better as expected.    Follow the instructions for any medication you are prescribed.     YOU SHOULD SEEK MEDICAL ATTENTION IMMEDIATELY, EITHER HERE OR AT THE NEAREST EMERGENCY DEPARTMENT, IF ANY OF THE FOLLOWING HAPPENS:   You have a fever (temperature higher than 100.4F or 38C).   Your pain does not go away or gets worse.   Your leg or joints (hip, knee, ankle, etc.) are red or swollen.   Your chest hurts or you get short of breath.   You have any other symptoms or concerns, or  don't get better as expected.    If you can't follow up with your doctor, or if at any time you feel you need to be rechecked or seen again, come back here or go to the nearest emergency department.             Abnormal Laboratory Result    During your visit today, one of your tests was abnormal.    Abnormal laboratory results are common. Most of the time an incidental abnormal laboratory result is nothing serious. Your doctor today doesn't think anything needs to be done about your laboratory result right away. You might need another exam or more tests to find out why you have this abnormal lab result. At this time, the cause of laboratory results does not seem dangerous. You do not need to stay in the hospital.    We don't believe your condition is dangerous right now. However, you need to be careful. Sometimes a problem that seems small can get serious later. This is why it is very important to come back here or go to the nearest Emergency Department unless you are 100% improved.    In rare cases an abnormal laboratory result can be a sign of something serious or that you are developing an illness. This   is why it's important for your primary doctor to keep a close eye on you. They will need to recheck your abnormal laboratory result.    YOU SHOULD SEEK MEDICAL ATTENTION IMMEDIATELY, EITHER HERE OR AT THE NEAREST EMERGENCY DEPARTMENT, IF ANY OF THE FOLLOWING OCCURS:     You can't get your laboratory result rechecked in the time period your doctor recommended today.   You have a fever (temperature higher than 100.4F or 38C).    You get any other symptoms, concerns, or don't get better as expected.   You get worse or feel you can't wait until your follow-up appointment to get treated.    If you can't follow up with your doctor, or if at any time you feel you need to be rechecked or seen again, come back here or go to the nearest emergency department.

## 2018-04-25 ENCOUNTER — Emergency Department
Admission: EM | Admit: 2018-04-25 | Discharge: 2018-04-25 | Disposition: A | Discharge status: Home / Self Care | Payer: Charity | Attending: Emergency Medicine | Admitting: Emergency Medicine

## 2018-04-25 ENCOUNTER — Emergency Department: Payer: Self-pay

## 2018-04-25 DIAGNOSIS — Z789 Other specified health status: Secondary | ICD-10-CM

## 2018-04-25 DIAGNOSIS — F329 Major depressive disorder, single episode, unspecified: Secondary | ICD-10-CM | POA: Insufficient documentation

## 2018-04-25 DIAGNOSIS — F419 Anxiety disorder, unspecified: Secondary | ICD-10-CM | POA: Insufficient documentation

## 2018-04-25 DIAGNOSIS — F332 Major depressive disorder, recurrent severe without psychotic features: Secondary | ICD-10-CM

## 2018-04-25 LAB — URINALYSIS, REFLEX TO MICROSCOPIC EXAM IF INDICATED
Bilirubin, UA: NEGATIVE
Blood, UA: NEGATIVE
Glucose, UA: NEGATIVE
Ketones UA: 20 — AB
Nitrite, UA: NEGATIVE
Protein, UR: 30 — AB
Specific Gravity UA: 1.031 (ref 1.001–1.035)
Urine pH: 6 (ref 5.0–8.0)
Urobilinogen, UA: 2 mg/dL

## 2018-04-25 LAB — COMPREHENSIVE METABOLIC PANEL
ALT: 39 U/L (ref 0–55)
AST (SGOT): 39 U/L — ABNORMAL HIGH (ref 5–34)
Albumin/Globulin Ratio: 1 (ref 0.9–2.2)
Albumin: 3.9 g/dL (ref 3.5–5.0)
Alkaline Phosphatase: 80 U/L (ref 37–106)
BUN: 14 mg/dL (ref 7.0–19.0)
Bilirubin, Total: 0.6 mg/dL (ref 0.2–1.2)
CO2: 21 mEq/L — ABNORMAL LOW (ref 22–29)
Calcium: 9.7 mg/dL (ref 8.5–10.5)
Chloride: 103 mEq/L (ref 100–111)
Creatinine: 0.7 mg/dL (ref 0.6–1.0)
Globulin: 3.8 g/dL — ABNORMAL HIGH (ref 2.0–3.6)
Glucose: 106 mg/dL — ABNORMAL HIGH (ref 70–100)
Potassium: 4.5 mEq/L (ref 3.5–5.1)
Protein, Total: 7.7 g/dL (ref 6.0–8.3)
Sodium: 138 mEq/L (ref 136–145)

## 2018-04-25 LAB — PHOSPHORUS: Phosphorus: 2.9 mg/dL (ref 2.3–4.7)

## 2018-04-25 LAB — RAPID DRUG SCREEN, URINE
Barbiturate Screen, UR: NEGATIVE
Benzodiazepine Screen, UR: NEGATIVE
Cannabinoid Screen, UR: NEGATIVE
Cocaine, UR: POSITIVE — AB
Opiate Screen, UR: NEGATIVE
PCP Screen, UR: NEGATIVE
Urine Amphetamine Screen: NEGATIVE

## 2018-04-25 LAB — ECG 12-LEAD
Atrial Rate: 74 {beats}/min
P Axis: 37 degrees
P-R Interval: 156 ms
Q-T Interval: 424 ms
QRS Duration: 78 ms
QTC Calculation (Bezet): 470 ms
R Axis: 17 degrees
T Axis: 15 degrees
Ventricular Rate: 74 {beats}/min

## 2018-04-25 LAB — ETHANOL: Alcohol: NOT DETECTED mg/dL

## 2018-04-25 LAB — CBC AND DIFFERENTIAL
Absolute NRBC: 0 10*3/uL (ref 0.00–0.00)
Basophils Absolute Automated: 0.04 10*3/uL (ref 0.00–0.08)
Basophils Automated: 0.9 %
Eosinophils Absolute Automated: 0.07 10*3/uL (ref 0.00–0.44)
Eosinophils Automated: 1.5 %
Hematocrit: 39.5 % (ref 34.7–43.7)
Hgb: 12.9 g/dL (ref 11.4–14.8)
Immature Granulocytes Absolute: 0.01 10*3/uL (ref 0.00–0.07)
Immature Granulocytes: 0.2 %
Lymphocytes Absolute Automated: 1.29 10*3/uL (ref 0.42–3.22)
Lymphocytes Automated: 28.2 %
MCH: 29.1 pg (ref 25.1–33.5)
MCHC: 32.7 g/dL (ref 31.5–35.8)
MCV: 89.2 fL (ref 78.0–96.0)
MPV: 11.1 fL (ref 8.9–12.5)
Monocytes Absolute Automated: 0.41 10*3/uL (ref 0.21–0.85)
Monocytes: 9 %
Neutrophils Absolute: 2.76 10*3/uL (ref 1.10–6.33)
Neutrophils: 60.2 %
Nucleated RBC: 0 /100 WBC (ref 0.0–0.0)
Platelets: 229 10*3/uL (ref 142–346)
RBC: 4.43 10*6/uL (ref 3.90–5.10)
RDW: 13 % (ref 11–15)
WBC: 4.58 10*3/uL (ref 3.10–9.50)

## 2018-04-25 LAB — TSH: TSH: 1.48 u[IU]/mL (ref 0.35–4.94)

## 2018-04-25 LAB — GFR: EGFR: >60

## 2018-04-25 LAB — ACETAMINOPHEN LEVEL: Acetaminophen Level: <7 ug/mL — ABNORMAL LOW (ref 10–30)

## 2018-04-25 LAB — SALICYLATE LEVEL: Salicylate Level: <5 mg/dL — ABNORMAL LOW (ref 15.0–30.0)

## 2018-04-25 LAB — MAGNESIUM: Magnesium: 1.9 mg/dL (ref 1.6–2.6)

## 2018-04-25 MED ORDER — HYDROXYZINE PAMOATE 25 MG PO CAPS
25.00 mg | ORAL_CAPSULE | Freq: Three times a day (TID) | ORAL | 0 refills | Status: AC | PRN
Start: 2018-04-25 — End: ?

## 2018-04-25 NOTE — Discharge Instructions (Signed)
Dear Margaret Nicholson:    Thank you for choosing the [facility] Emergency Department, the premier emergency department in the [place] area.  I hope your visit today was EXCELLENT.    Specific instructions for your visit today:    Anxiety, Panic    You have been diagnosed with an anxiety and depsression    Anxiety causes very strong feelings of worry and fear. It may also cause chest pain or shortness of breath. You may feel like you have palpitations (a racing heart). You might feel numbness (like parts of your body are "asleep"), especially around the mouth and in the hands or feet. PLEASE FOLLOW UP Monday AT THE WALK IN CLINIC, OUR PSYCHIATRY TEAM HAS PROVIDED YOU INFORMATION ABOUT THIS.     It is important to see a counselor and a family doctor on a regular basis. They can help you with your problem. They can also keep a close eye on you and see if you get better.    YOU SHOULD SEEK MEDICAL ATTENTION IMMEDIATELY, EITHER HERE OR AT THE NEAREST EMERGENCY DEPARTMENT, IF ANY OF THE FOLLOWING OCCURS:   You have symptoms that you normally don't have with your anxiety attacks.   You think of harming yourself (suicidal thoughts) or harming someone else.   You have symptoms you normally don't have and they last longer than normal or your medicine doesn't help. These include chest pain, passing out, feeling that your heart is racing or shortness of breath.   You have a fever (temperature higher than 100.60F / 38C).         If you do not continue to improve or your condition worsens, please contact your doctor or return immediately to the Emergency Department.    Sincerely,  Tawny Hopping, MD  Attending Emergency Physician  [facility] Emergency Department    ONSITE PHARMACY  Our full service onsite pharmacy is located in the ER waiting room.  Open 7 days a week from 9 am to 9 pm.  We accept all major insurances and prices are competitive with major retailers.  Ask your provider to print your  prescriptions down to the pharmacy to speed you on your way home.    OBTAINING A PRIMARY CARE APPOINTMENT    Primary care physicians (PCPs, also known as primary care doctors) are either internists or family medicine doctors. Both types of PCPs focus on health promotion, disease prevention, patient education and counseling, and treatment of acute and chronic medical conditions.    Call for an appointment with a primary care doctor.  Ask to see who is taking new patients.     [facility]  telephone:  [phone]  [email or web address]    DOCTOR REFERRALS  Call [phone] (available 24 hours a day, 7 days a week) if you need any further referrals and we can help you find a primary care doctor or specialist.  Also, available online at:  [email or web address]    YOUR CONTACT INFORMATION  Before leaving please check with registration to make sure we have an up-to-date contact number.  You can call registration at [phone] to update your information.  For questions about your hospital bill, please call [phone].  For questions about your Emergency Dept Physician bill please call [phone].      FREE HEALTH SERVICES  If you need help with health or social services, please call 2-1-1 for a free referral to resources in your area.  2-1-1 is a free service connecting people  with information on health insurance, free clinics, pregnancy, mental health, dental care, food assistance, housing, and substance abuse counseling.  Also, available online at:  http://www.[facility].org    MEDICAL RECORDS AND TESTS  Certain laboratory test results do not come back the same day, for example urine cultures.   We will contact you if other important findings are noted.  Radiology films are often reviewed again to ensure accuracy.  If there is any discrepancy, we will notify you.      Please call [phone] to pick up a complimentary CD of any radiology studies performed.  If you or your doctor  would like to request a copy of your medical records, please call [phone].      ORTHOPEDIC INJURY   Please know that significant injuries can exist even when an initial x-ray is read as normal or negative.  This can occur because some fractures (broken bones) are not initially visible on x-rays.  For this reason, close outpatient follow-up with your primary care doctor or bone specialist (orthopedist) is required.    MEDICATIONS AND FOLLOWUP  Please be aware that some prescription medications can cause drowsiness.  Use caution when driving or operating machinery.    The examination and treatment you have received in our Emergency Department is provided on an emergency basis, and is not intended to be a substitute for your primary care physician.  It is important that your doctor checks you again and that you report any new or remaining problems at that time.      24 HOUR PHARMACIES  The nearest 24 hour pharmacy is:    [facility]  [address]  [place], [place] [address]  [phone]      ASSISTANCE WITH INSURANCE    Affordable Care Act  [facility])  Call to start or finish an application, compare plans, enroll or ask a question.  [phone]  TTY: [phone]  Web:  Healthcare.gov    Help Enrolling in [facility]  [employer] [place]  9372974851 (TOLL-FREE)  418-172-6587 (TTY)  Web:  Http://www.coverva.org    Local Help Enrolling in the [facility]  [place] [facility]  680-374-5437 (MAIN)  Email:  health-help@nvfs .org  Web:  [employer]  Address:  [address], [address] [place], [place] [address]    SEDATING MEDICATIONS  Sedating medications include strong pain medications (e.g. narcotics), muscle relaxers, benzodiazepines (used for anxiety and as muscle relaxers), Benadryl/diphenhydramine and other antihistamines for allergic reactions/itching, and other medications.  If you are unsure if you have received a sedating medication, please ask your physician or nurse.  If you  received a sedating medication: DO NOT drive a car. DO NOT operate machinery. DO NOT perform jobs where you need to be alert.  DO NOT drink alcoholic beverages while taking this medicine.     If you get dizzy, sit or lie down at the first signs. Be careful going up and down stairs.  Be extra careful to prevent falls.     Never give this medicine to others.     Keep this medicine out of reach of children.     Do not take or save old medicines. Throw them away when outdated.     Keep all medicines in a cool, dry place. DO NOT keep them in your bathroom medicine cabinet or in a cabinet above the stove.    MEDICATION REFILLS  Please be aware that we cannot refill any prescriptions through the ER. If you need further  treatment from what is provided at your ER visit, please follow up with your primary care doctor or your pain management specialist.    [place]  Did you know [facility] has two freestanding ERs located just a few miles away?  [facility] [place] have short wait times, easy free parking directly in front of the building and top patient satisfaction scores - and the same Board Certified Emergency Medicine doctors as [facility]

## 2018-04-25 NOTE — ED Triage Notes (Signed)
Patient arrives to room with friend at side; ambulatory to ER room, breathing is easy and unlabored, no acute physical distress; patient tearfully admits that she has started taking medication for depression but "doesn't feel any better." Having a hard time getting up and even showering she reports. Here for additional help, denies wanting to harm self or others, has no plan or intent at this time.

## 2018-04-25 NOTE — ED Provider Notes (Signed)
[facility] EMERGENCY DEPARTMENT H&P      Visit date: 04/25/2018      CLINICAL SUMMARY           Diagnosis:    .     Final diagnoses:   Anxiety and depression         MDM Notes:      32 year old with history of anxiety, alcohol use disorder presents with ongoing anxiety, depression symptoms without any homicidal or suicidal ideation.  She does have significant life impact with her symptoms.  Is taking trazodone with little relief.  She was seen by our psychiatric liaison, encouraged to follow-up at the walk-in clinic as she was instructed to do in the past however has not yet done so.  She has no signs of medical illness at this time.  We will plan for outpatient follow-up.  Patient agreeable.  Given a short course of Vistaril to assist with short-term anxiety control. Encouraged to cease substance abuse as well (cocaine + UA).         Disposition:         Discharge           I was present for the bedside discharge alongside the patient's ED nurse. Course of visit in the ED and discharge instructions were reviewed with patient and they were given the opportunity to ask any questions regarding their care today. Patient and/ or patient's family verbalized understanding of, and comfort with, instructions and plan.          Discharge Prescriptions     Medication Sig Dispense Auth. Provider    hydrOXYzine (VISTARIL) 25 MG capsule Take 1 capsule (25 mg total) by mouth 3 (three) times daily as needed for Anxiety 30 capsule Tawny Hopping, MD                         CLINICAL INFORMATION        HPI:      Chief Complaint: Psychiatric Evaluation  .    Margaret Nicholson is a 32 y.o. female w/ a hx of depression who presents with a moderate persistent episode of depression for approximately 1 week.     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 She hasn't been to work in about 1 week and is not motivated to get out of bed. She was  prescribed Trazodone 1 week ago at [facility], but states it is not working, thus prompting her presentation to the ED.     Associated symptoms include difficulty sleeping. Endorses decrease in PO and a lack of desire to complete normal daily functions such as going to the store or even leaving her room. Denies SI or HI within the past week. Denies any hallucinations, recent drug use. Pt currently takes birth control.    Of note, pt had a f/u appointment scheduled at [facility] scheduled within the past few days but did not feel like going.     History obtained from: patient, psych liaison          ROS:      Positive and negative ROS elements as per HPI.  All other systems reviewed and negative.      Physical Exam:      Pulse (!) 107  BP (!) 142/106  Resp 16  SpO2 100 %  Temp 97.8 F (36.6 C)    PHYSICAL EXAM:   BP 146/82  Pulse 84   Temp 98 F (36.7 C) (Oral)   Resp 16   Ht 5\' 7"  (1.702 m)   Wt 86.2 kg   SpO2 99%   BMI 29.76 kg/m   General appearance: Patient sitting in bed comfortably in no apparent distress. Well nourished. Non-toxic appearing.  HEENT: Head is normocephalic and atraumatic. Sclerae anicteric.  Pharynx clear without exudate.   Neck: Supple  Pulmonary: Normal work of breathing. Clear to ascultation bilaterally without wheezing, rales or rhonchi.   Cardiovascular: Regular, rate and rhythm. Normal S1, S2 without S3, S4. No murmurs appreciated.  Abdominal: Soft, non-tender, non-distended without rebound or guarding. No masses. No organomegaly  GU: No CVA tenderness  Extremities: No edema appreciated. Warm and well perfused.  Musculoskeletal: No gross deformities appreciated.   Neurologic: Awake and alert.   Psychiatric: Cooperative. Tearful. Depressed affect.   Skin: Color normal for ethnicity. No rashes appreciated              PAST HISTORY        Primary Care Provider: Pcp, None, MD        PMH/PSH:    .     Past Medical History:   Diagnosis Date   . Depression        She has a past  surgical history that includes Cyst Removal.      Social/Family History:      She reports that she has been smoking Cigarettes.  She has never used smokeless tobacco. She reports that she drinks alcohol. She reports that she does not use drugs.    History reviewed. No pertinent family history.      Listed Medications on Arrival:    .     Home Medications     Med List Status:  Complete Set By: Raelyn Number, RN at 04/25/2018 11:15 AM                norgestimate-ethinyl estradiol (ORTHO-CYCLEN) 0.25-35 MG-MCG per tablet     Take 1 tablet by mouth daily     TRAZODONE HCL ER PO     Take by mouth         Allergies: She is allergic to penicillins.            VISIT INFORMATION        Clinical Course in the ED:                 Medications Given in the ED:    .     ED Medication Orders     None            Procedures:      Procedures      Interpretations:      EKG Interpretation  Reviewed and Interpreted by ER physician, Orvis Brill, MD   Rhythm: Sinus  Rate: 74  Intervals/conduction: Normal intervals  Normal Axis  ST Segments/T wave: No ischemic appearing ST elevations/depressions. No T wave changes.  No STEMI                 RESULTS        Lab Results:      Results     Procedure Component Value Units Date/Time    UA, Reflex to Microscopic (All hospital ED's and [facility], pts 3+ yrs) [[id number]]  (Abnormal) Collected:  04/25/18 1154    Specimen:  Urine Updated:  04/25/18 1326     Urine Type Clean Catch     Color, UA Amber (  A)     Clarity, UA Hazy     Specific Gravity UA 1.031     Urine pH 6.0     Leukocyte Esterase, UA Trace (A)     Nitrite, UA Negative     Protein, UR 30 (A)     Glucose, UA Negative     Ketones UA 20 (A)     Urobilinogen, UA 2.0 mg/dL      Bilirubin, UA Negative     Blood, UA Negative     RBC, UA 0 - 2 /hpf      WBC, UA 0 - 5 /hpf      Squamous Epithelial Cells, Urine 0 - 5 /hpf      Urine Mucus Present    TSH [[id number]] Collected:  04/25/18 1154    Specimen:  Blood Updated:  04/25/18 1256      TSH 1.48 uIU/mL     GFR [[id number]] Collected:  04/25/18 1154     Updated:  04/25/18 1234     EGFR >60.0    Comprehensive metabolic panel (CMP) [[id number]]  (Abnormal) Collected:  04/25/18 1154    Specimen:  Blood Updated:  04/25/18 1234     Glucose 106 (H) mg/dL      BUN 32.4 mg/dL      Creatinine 0.7 mg/dL      Sodium 401 mEq/L      Potassium 4.5 mEq/L      Chloride 103 mEq/L      CO2 21 (L) mEq/L      Calcium 9.7 mg/dL      Protein, Total 7.7 g/dL      Albumin 3.9 g/dL      AST (SGOT) 39 (H) U/L      ALT 39 U/L      Alkaline Phosphatase 80 U/L      Bilirubin, Total 0.6 mg/dL      Globulin 3.8 (H) g/dL      Albumin/Globulin Ratio 1.0    Magnesium [[id number]] Collected:  04/25/18 1154    Specimen:  Blood Updated:  04/25/18 1234     Magnesium 1.9 mg/dL     Phosphorus [[id number]] Collected:  04/25/18 1154    Specimen:  Blood Updated:  04/25/18 1234     Phosphorus 2.9 mg/dL     Alcohol (Ethanol)  Level [[id number]] Collected:  04/25/18 1154    Specimen:  Blood Updated:  04/25/18 1234     Alcohol None Detected mg/dL     Acetaminophen level [[id number]]  (Abnormal) Collected:  04/25/18 1154    Specimen:  Blood Updated:  04/25/18 1234     Acetaminophen Level <7 (L) ug/mL     ASA  level [[id number]]  (Abnormal) Collected:  04/25/18 1154    Specimen:  Blood Updated:  04/25/18 1234     Salicylate Level <5.0 (L) mg/dL     Urine Rapid Drug Screen [[id number]]  (Abnormal) Collected:  04/25/18 1154    Specimen:  Urine Updated:  04/25/18 1227     Amphetamine Screen, UR Negative     Barbiturate Screen, UR Negative     Benzodiazepine Screen, UR Negative     Cannabinoid Screen, UR Negative     Cocaine, UR Positive (A)     Opiate Screen, UR Negative     PCP Screen, UR Negative    CBC with differential [[id number]] Collected:  04/25/18 1154    Specimen:  Blood from Blood Updated:  04/25/18 1222     WBC 4.58 x10 3/uL  Hgb 12.9 g/dL      Hematocrit 16.1 %      Platelets 229 x10 3/uL      RBC 4.43 x10 6/uL      MCV 89.2 fL      MCH 29.1  pg      MCHC 32.7 g/dL      RDW 13 %      MPV 11.1 fL      Neutrophils 60.2 %      Lymphocytes Automated 28.2 %      Monocytes 9.0 %      Eosinophils Automated 1.5 %      Basophils Automated 0.9 %      Immature Granulocyte 0.2 %      Nucleated RBC 0.0 /100 WBC      Neutrophils Absolute 2.76 x10 3/uL      Abs Lymph Automated 1.29 x10 3/uL      Abs Mono Automated 0.41 x10 3/uL      Abs Eos Automated 0.07 x10 3/uL      Absolute Baso Automated 0.04 x10 3/uL      Absolute Immature Granulocyte 0.01 x10 3/uL      Absolute NRBC 0.00 x10 3/uL               Radiology Results:      No orders to display               Scribe Attestation:      I was acting as a Neurosurgeon for Tawny Hopping, MD on Karyl Kinnier  Treatment Team: Scribe: Consuelo Pandy     I am the first provider for this patient and I personally performed the services documented. Treatment Team: Scribe: Consuelo Pandy is scribing for me on Vasconcelos,Kandiss ELYSE This note and the patient instructions accurately reflect work and decisions made by me.  Tawny Hopping, MD            Tawny Hopping, MD  04/26/18 [phone]
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
## 2018-09-24 ENCOUNTER — Emergency Department
Admission: EM | Admit: 2018-09-24 | Discharge: 2018-09-24 | Disposition: A | Discharge status: Home / Self Care | Payer: Self-pay | Attending: Emergency Medicine | Admitting: Emergency Medicine

## 2018-09-24 ENCOUNTER — Emergency Department: Payer: Self-pay

## 2018-09-24 DIAGNOSIS — F17210 Nicotine dependence, cigarettes, uncomplicated: Secondary | ICD-10-CM | POA: Insufficient documentation

## 2018-09-24 DIAGNOSIS — R05 Cough: Secondary | ICD-10-CM | POA: Insufficient documentation

## 2018-09-24 DIAGNOSIS — R059 Cough, unspecified: Secondary | ICD-10-CM

## 2018-09-24 DIAGNOSIS — J069 Acute upper respiratory infection, unspecified: Secondary | ICD-10-CM | POA: Insufficient documentation

## 2018-09-24 LAB — POCT RAPID STREP A: Rapid Strep A Screen POCT: NEGATIVE

## 2018-09-24 LAB — URINE BHCG POC: Urine bHCG POC: NEGATIVE

## 2018-09-24 MED ORDER — AZITHROMYCIN 250 MG PO TABS
ORAL_TABLET | ORAL | 0 refills | Status: AC
Start: 2018-09-24 — End: 2018-09-29

## 2018-09-24 MED ORDER — GUAIFENESIN ER 600 MG PO TB12
1200.00 mg | ORAL_TABLET | Freq: Two times a day (BID) | ORAL | 0 refills | Status: AC
Start: 2018-09-24 — End: ?

## 2018-09-24 MED ORDER — DEXAMETHASONE SOD PHOSPHATE PF 10 MG/ML IJ SOLN
10.00 mg | Freq: Once | INTRAMUSCULAR | Status: AC
Start: 2018-09-24 — End: 2018-09-24
  Administered 2018-09-24: 09:00:00 10 mg via ORAL
  Filled 2018-09-24: qty 1

## 2018-09-24 MED ORDER — ALBUTEROL SULFATE HFA 108 (90 BASE) MCG/ACT IN AERS
2.0000 | INHALATION_SPRAY | RESPIRATORY_TRACT | 0 refills | Status: AC | PRN
Start: 2018-09-24 — End: 2018-10-24

## 2018-09-24 NOTE — Discharge Instructions (Signed)
Cough     You have been seen for your cough.     There are many possible causes of cough. Most are not dangerous. Your doctor has determined that it is OK for you to go home today.     Antibiotics are not necessary for your cough at this time. If your cough was caused by a virus, asthma, or lung irritation, then antibiotics will not help.     The doctor may have prescribed some medicine to help with your cough. Use the medicine as directed.     YOU SHOULD SEEK MEDICAL ATTENTION IMMEDIATELY, EITHER HERE OR AT THE NEAREST EMERGENCY DEPARTMENT, IF ANY OF THE FOLLOWING OCCURS:  · You wheeze or have trouble breathing.  · You cough up mucous or lose weight for no reason.  · You have a fever (temperature higher than 100.4ºF / 38ºC) that lasts more than 5 days.  · You have chest pain.  · Your symptoms get worse or do not get better in 2 or 3 days.  · You have any new problems or concerns.

## 2018-09-24 NOTE — ED Triage Notes (Signed)
Patient with cough and sore throat for the past couple of days with cough getting worse. Coughed up some blood this morning.  Current everyday smoker.  Did not take any medications this morning.

## 2018-09-24 NOTE — ED Provider Notes (Signed)
EMERGENCY DEPARTMENT HISTORY AND PHYSICAL EXAM     Physician/Midlevel provider first contact with patient: 09/24/18 0744         Date: 09/24/2018  Patient Name: Margaret Nicholson  Attending Physician: Elliot Cousin, MD  Advanced Practice Provider: Volanda Napoleon    History of Presenting Illness       History Provided By: Patient    Chief Complaint:  Chief Complaint   Patient presents with    Cough         Additional History: Margaret Nicholson is a 33 y.o. female presenting to the ED with cough and sore throat x 2 weeks. +nasal congestion and runny nose, sore throat. Reports initially tactile fevers, none since. Reports coughing up bloody mucus last night (blood streaked, no frank hemoptysis).  Current everyday smoker.  Did not take any medications this morning. Otherwise, has been using nyquil and dayquil. +sick contacts at work. No n/v/d/abd pain/cp/sob. No hx of dvt, pe, recent prolonged immobilizations.  No recent travel.   No OCPs.    PCP: Pcp, None, MD  SPECIALISTS:    No current facility-administered medications for this encounter.      Current Outpatient Medications   Medication Sig Dispense Refill    albuterol (PROVENTIL HFA;VENTOLIN HFA) 108 (90 Base) MCG/ACT inhaler Inhale 2 puffs into the lungs every 4 (four) hours as needed for Wheezing or Shortness of Breath (coughing) Dispense with spacer 1 Inhaler 0    azithromycin (ZITHROMAX) 250 MG tablet Take 2 tablets (500 mg total) by mouth daily for 1 day, THEN 1 tablet (250 mg total) daily for 4 days. 6 tablet 0    guaiFENesin (MUCINEX) 600 MG 12 hr tablet Take 2 tablets (1,200 mg total) by mouth 2 (two) times daily 14 tablet 0    hydrOXYzine (VISTARIL) 25 MG capsule Take 1 capsule (25 mg total) by mouth 3 (three) times daily as needed for Anxiety 30 capsule 0    norgestimate-ethinyl estradiol (ORTHO-CYCLEN) 0.25-35 MG-MCG per tablet Take 1 tablet by mouth daily      TRAZODONE HCL ER PO Take by mouth         Past History     Past Medical  History:  Past Medical History:   Diagnosis Date    Depression        Past Surgical History:  Past Surgical History:   Procedure Laterality Date    CYST REMOVAL      neck       Family History:  History reviewed. No pertinent family history.    Social History:  Social History     Tobacco Use    Smoking status: Current Every Day Smoker     Types: Cigarettes    Smokeless tobacco: Never Used    Tobacco comment: 2-3 cigarettes per day   Substance Use Topics    Alcohol use: Yes    Drug use: No       Allergies:  Allergies   Allergen Reactions    Penicillins Rash       Review of Systems     Review of Systems   HENT: Positive for congestion, rhinorrhea and sore throat.    Respiratory: Positive for cough.    All other systems reviewed and are negative.      Physical Exam     Vitals:    09/24/18 0743   BP: (!) 163/98   Pulse: 83   Resp: 18   Temp: 97.3 F (36.3 C)   TempSrc:  Oral   SpO2: 99%   Weight: 83.9 kg   Height: 5\' 6"  (1.676 m)       Physical Exam   Constitutional: She is oriented to person, place, and time. She appears well-developed and well-nourished. No distress.   HENT:   Head: Normocephalic and atraumatic.   Nasal congestion. Slight posterior OP erythema, no exudate, midline uvula    Eyes: EOM are normal.   Neck: Normal range of motion. Neck supple.   Cardiovascular: Normal rate, regular rhythm, normal heart sounds and intact distal pulses.   2+ radial pulses   Pulmonary/Chest: Effort normal and breath sounds normal. No respiratory distress. She has no wheezes. She has no rales. She exhibits no tenderness.   Congested cough   Abdominal: Soft. She exhibits no distension. There is no abdominal tenderness.   Musculoskeletal: Normal range of motion.         General: No deformity.   Neurological: She is alert and oriented to person, place, and time. Coordination normal.   Sensation intact to light touch   Skin: Skin is warm and dry. No rash noted. She is not diaphoretic.   Psychiatric: She has a normal mood  and affect.       Diagnostic Study Results     Labs -     Results     Procedure Component Value Units Date/Time    Rapid influenza A/B antigens [[id number]] Collected:  09/24/18 0753    Specimen:  Nasopharyngeal from Nasal Aspirate Updated:  09/24/18 0819    Narrative:       ORDER#: [id number]                                    ORDERED BY: Dorothea Glassman, JULI  SOURCE: Nasal Aspirate                               COLLECTED:  09/24/18 07:53  ANTIBIOTICS AT COLL.:                                RECEIVED :  09/24/18 08:04  Influenza Rapid Antigen A&B                FINAL       09/24/18 08:19  09/24/18   Negative for Influenza A and B             Reference Range: Negative      Urine BHCG POC [[id number]] Collected:  09/24/18 0757     Updated:  09/24/18 0805     Urine bHCG POC Negative    Rapid Strep POCT [[id number]]  (Normal) Collected:  09/24/18 0751    Specimen:  Throat Updated:  09/24/18 0802     POCT QC Pass     Rapid Strep A Screen POCT Negative      Comment Negative Results should be confirmed by throat Cx to confirm absence of Strep A inf.          Radiologic Studies -   Radiology Results (24 Hour)     Procedure Component Value Units Date/Time    XR Chest 2 Views [[id number]] Collected:  09/24/18 7564    Order Status:  Completed Updated:  09/24/18 0812    Narrative:       PA AND LATERAL CHEST  CLINICAL HISTORY: Rule out pneumonia.    COMPARISON: No prior studies are available for comparison.    FINDINGS: Lungs are clear. No focal consolidation. No pleural effusion  or pneumothorax. Cardiac silhouette and pulmonary vascularity are within  normal limits.    Visualized upper abdomen is unremarkable. Osseous structures are intact.        Impression:         No acute cardiopulmonary abnormality    Drue Dun, MD   09/24/2018 8:08 AM      .    Medical Decision Making   I am the first provider for this patient.    I reviewed the vital signs, available nursing notes, past medical history, past surgical history, family history  and social history.    Vital Signs-Reviewed the patient's vital signs.     No data found.    Pulse Oximetry Analysis - Normal SpO2: 99 % on RA    EKG:      Critical Care:      Procedures:       Old Medical Records: Old medical records.       ED Medications  Medications   dexamethasone (PF) (DECADRON) 10 mg/mL injection 10 mg (10 mg Oral Given 09/24/18 1191)       ED Course:        Provider Notes:   Patient appears nontoxic, stable vitals, no resp distress.  Neg CXR, neg influenza. Neg strep. Do not suspect PE, ACS. Discussed meds in the ED, watch and wait abx.    The patient/guardian voiced understanding of the return precautions.. Discussed most common side effects of medications given. Solicited, welcomed and answered all patient/guardian questions.        Diagnosis     Clinical Impression:   1. Upper respiratory tract infection, unspecified type    2. Cough        Treatment Plan:   ED Disposition     ED Disposition Condition Date/Time Comment    Discharge  Thu Sep 24, 2018  8:32 AM Neomia Dear Strength discharge to home/self care.    Condition at disposition: Stable            _______________________________    CHART OWNERSHIPAnastasia Pall, PA-C, am the primary clinician of record.  _______________________________       Volanda Napoleon, PA  09/26/18 1056
